# Patient Record
Sex: Male | Born: 1935 | Race: White | Hispanic: No | Smoking: Former smoker
Health system: Southern US, Community
[De-identification: ages and names within clinical notes are randomized; demographics above are authoritative.]

## PROBLEM LIST (undated history)

## (undated) DIAGNOSIS — J449 Chronic obstructive pulmonary disease, unspecified: Secondary | ICD-10-CM

## (undated) DIAGNOSIS — I714 Abdominal aortic aneurysm, without rupture, unspecified: Secondary | ICD-10-CM

## (undated) HISTORY — DX: Abdominal aortic aneurysm, without rupture, unspecified: I71.40

## (undated) HISTORY — DX: Chronic obstructive pulmonary disease, unspecified: J44.9

## (undated) HISTORY — DX: Abdominal aortic aneurysm, without rupture: I71.4

## (undated) HISTORY — PX: ABDOMINAL AORTIC ANEURYSM REPAIR: SUR1152

## (undated) HISTORY — PX: TONSILLECTOMY: SUR1361

## (undated) HISTORY — PX: APPENDECTOMY: SHX54

## (undated) HISTORY — PX: REPLACEMENT TOTAL KNEE: SUR1224

---

## 2009-06-02 DIAGNOSIS — J309 Allergic rhinitis, unspecified: Secondary | ICD-10-CM | POA: Insufficient documentation

## 2012-12-26 ENCOUNTER — Ambulatory Visit: Payer: Self-pay | Admitting: Ophthalmology

## 2012-12-26 LAB — POTASSIUM: Potassium: 4.4 mmol/L (ref 3.5–5.1)

## 2013-01-02 ENCOUNTER — Ambulatory Visit: Payer: Self-pay | Admitting: Ophthalmology

## 2013-01-11 ENCOUNTER — Ambulatory Visit: Payer: Self-pay | Admitting: Ophthalmology

## 2013-01-23 ENCOUNTER — Ambulatory Visit: Payer: Self-pay | Admitting: Ophthalmology

## 2014-04-01 DIAGNOSIS — E669 Obesity, unspecified: Secondary | ICD-10-CM | POA: Insufficient documentation

## 2014-04-01 DIAGNOSIS — K279 Peptic ulcer, site unspecified, unspecified as acute or chronic, without hemorrhage or perforation: Secondary | ICD-10-CM | POA: Insufficient documentation

## 2014-04-01 DIAGNOSIS — Z87891 Personal history of nicotine dependence: Secondary | ICD-10-CM | POA: Insufficient documentation

## 2014-04-01 DIAGNOSIS — N400 Enlarged prostate without lower urinary tract symptoms: Secondary | ICD-10-CM | POA: Insufficient documentation

## 2014-04-01 DIAGNOSIS — R059 Cough, unspecified: Secondary | ICD-10-CM | POA: Insufficient documentation

## 2014-04-01 DIAGNOSIS — R0902 Hypoxemia: Secondary | ICD-10-CM | POA: Insufficient documentation

## 2014-04-01 DIAGNOSIS — R1310 Dysphagia, unspecified: Secondary | ICD-10-CM | POA: Insufficient documentation

## 2014-04-01 DIAGNOSIS — M1990 Unspecified osteoarthritis, unspecified site: Secondary | ICD-10-CM | POA: Insufficient documentation

## 2014-05-11 DIAGNOSIS — J387 Other diseases of larynx: Secondary | ICD-10-CM | POA: Insufficient documentation

## 2014-12-07 NOTE — Op Note (Signed)
PATIENT NAME:  Brent Liu, [name] MR#:  [id number] DATE OF BIRTH:  07/01/1936  DATE OF PROCEDURE:  01/02/2013  PREOPERATIVE DIAGNOSIS: Cataract, right eye.   POSTOPERATIVE DIAGNOSIS:  Cataract, right eye.  PROCEDURE PERFORMED:  Extracapsular cataract extraction using phacoemulsification with placement of an Alcon SN6CWS 24.5-diopter posterior chamber lens, serial [id number].  SURGEON:  Maylon PeppersSteven A. Analyn Matusek, MD  ASSISTANT:  None.  ANESTHESIA:  4% lidocaine and 0.75% Marcaine in a 50/50 mixture with 10 units/mL of Hylenex added given as a peribulbar.  ANESTHESIOLOGIST:  Linward NatalAmy Rice, MD  COMPLICATIONS:  None.  ESTIMATED BLOOD LOSS:  Less than 1 mL.  DESCRIPTION OF PROCEDURE:  The patient was brought to the operating room and given a peribulbar block.  The patient was then prepped and draped in the usual fashion.  The vertical rectus muscles were imbricated using 5-0 silk sutures.  These sutures were then clamped to the sterile drapes as bridle sutures.  A limbal peritomy was performed extending two clock hours and hemostasis was obtained with cautery.  A partial thickness scleral groove was made at the surgical limbus and dissected anteriorly in a lamellar dissection using an Alcon crescent knife.  The anterior chamber was entered superonasally with a Superblade and through the lamellar dissection with a 2.6 mm keratome.  DisCoVisc was used to replace the aqueous and a continuous tear capsulorrhexis was carried out.  Hydrodissection and hydrodelineation were carried out with balanced salt and a 27 gauge canula.  The nucleus was rotated to confirm the effectiveness of the hydrodissection.  Phacoemulsification was carried out using a divide-and-conquer technique.  Total ultrasound time was 1 minute and 23.4 seconds with an average power of 23.5 percent.  CDE of 33.19.  Irrigation/aspiration was used to remove the residual cortex.  DisCoVisc was used to inflate the capsule and the internal incision  was enlarged to 3 mm with the crescent knife.  The intraocular lens was folded and inserted into the capsular bag using the AcrySert Delivery System.   Irrigation/aspiration was used to remove the residual DisCoVisc.  Miostat was injected into the anterior chamber through the paracentesis track to inflate the anterior chamber and induce miosis.  The wound was checked for leaks and none were found. The conjunctiva was closed with cautery and the bridle sutures were removed.  Two drops of 0.3% Vigamox were placed on the eye.   An eye shield was placed on the eye.  The patient was discharged to the recovery room in good condition.   ____________________________ Maylon PeppersSteven A. Gershon Shorten, MD sad:cb D: 01/02/2013 14:07:07 ET T: 01/02/2013 15:56:43 ET JOB#: [id number]  cc: Viviann SpareSteven A. Miryah Ralls, MD, <Dictator> Erline LevineSTEVEN A Sabastien Tyler MD ELECTRONICALLY SIGNED 01/16/2013 13:56

## 2014-12-07 NOTE — Op Note (Signed)
PATIENT NAME:  Brent Liu, [name] MR#:  [id number] DATE OF BIRTH:  24-Dec-1935  DATE OF PROCEDURE:  01/23/2013  PREOPERATIVE DIAGNOSIS:  Cataract, left eye.    POSTOPERATIVE DIAGNOSIS:  Cataract, left eye.  PROCEDURE PERFORMED:  Extracapsular cataract extraction using phacoemulsification with placement of an Alcon SN6CWS, 25.0-diopter posterior chamber lens, serial #[id number].  SURGEON:  Maylon PeppersSteven A. Farren Nelles, MD  ASSISTANT:  None.  ANESTHESIA:  4% lidocaine and 0.75% Marcaine in a 50/50 mixture with 10 units/mL of Hylenex added, given as a peribulbar.   ANESTHESIOLOGIST:  Dr. Dimple Caseyice.  COMPLICATIONS:  None.  ESTIMATED BLOOD LOSS:  Less than 1 ml.  DESCRIPTION OF PROCEDURE:  The patient was brought to the operating room and given a peribulbar block.  The patient was then prepped and draped in the usual fashion.  The vertical rectus muscles were imbricated using 5-0 silk sutures.  These sutures were then clamped to the sterile drapes as bridle sutures.  A limbal peritomy was performed extending two clock hours and hemostasis was obtained with cautery.  A partial thickness scleral groove was made at the surgical limbus and dissected anteriorly in a lamellar dissection using an Alcon crescent knife.  The anterior chamber was entered supero-temporally with a Superblade and through the lamellar dissection with a 2.6 mm keratome.  DisCoVisc was used to replace the aqueous and a continuous tear capsulorrhexis was carried out.  Hydrodissection and hydrodelineation were carried out with balanced salt and a 27 gauge canula.  The nucleus was rotated to confirm the effectiveness of the hydrodissection.  Phacoemulsification was carried out using a divide-and-conquer technique.  Total ultrasound time was 1 minute and 10 seconds with an average power of 24.7 percent and CDE of 31.0.  Irrigation/aspiration was used to remove the residual cortex.  DisCoVisc was used to inflate the capsule and the internal  incision was enlarged to 3 mm with the crescent knife.  The intraocular lens was folded and inserted into the capsular bag using the AcrySert delivery system.  Irrigation/aspiration was used to remove the residual DisCoVisc.  Miostat was injected into the anterior chamber through the paracentesis track to inflate the anterior chamber and induce miosis.  A tenth of a milliliter of cefuroxime was placed into the anterior chamber via the paracentesis tract. The wound was checked for leaks and none were found. The conjunctiva was closed with cautery and the bridle sutures were removed.  Two drops of 0.3% Vigamox were placed on the eye.   An eye shield was placed on the eye.  The patient was discharged to the recovery room in good condition. ____________________________ Maylon PeppersSteven A. Maleah Rabago, MD sad:sb D: 01/23/2013 13:58:22 ET T: 01/23/2013 14:34:07 ET JOB#: [id number]  cc: Viviann SpareSteven A. Nakya Weyand, MD, <Dictator> Erline LevineSTEVEN A Haris Baack MD ELECTRONICALLY SIGNED 01/30/2013 13:32

## 2018-05-12 ENCOUNTER — Other Ambulatory Visit (HOSPITAL_COMMUNITY): Payer: Self-pay | Admitting: Pulmonary Disease

## 2018-05-12 ENCOUNTER — Other Ambulatory Visit: Payer: Self-pay | Admitting: Pulmonary Disease

## 2018-05-12 DIAGNOSIS — R7989 Other specified abnormal findings of blood chemistry: Secondary | ICD-10-CM

## 2018-05-13 ENCOUNTER — Encounter
Admission: RE | Admit: 2018-05-13 | Discharge: 2018-05-13 | Disposition: A | Discharge status: Home / Self Care | Payer: Medicare Other | Source: Ambulatory Visit | Attending: Pulmonary Disease | Admitting: Pulmonary Disease

## 2018-05-13 ENCOUNTER — Ambulatory Visit
Admission: RE | Admit: 2018-05-13 | Discharge: 2018-05-13 | Disposition: A | Discharge status: Home / Self Care | Payer: Medicare Other | Source: Ambulatory Visit | Attending: Pulmonary Disease | Admitting: Pulmonary Disease

## 2018-05-13 DIAGNOSIS — R7989 Other specified abnormal findings of blood chemistry: Secondary | ICD-10-CM

## 2018-05-13 MED ORDER — TECHNETIUM TO 99M ALBUMIN AGGREGATED
4.0000 | Freq: Once | INTRAVENOUS | Status: AC
  Administered 2018-05-13: 3.92 via INTRAVENOUS

## 2018-05-13 MED ORDER — TECHNETIUM TC 99M DIETHYLENETRIAME-PENTAACETIC ACID
30.0000 | Freq: Once | INTRAVENOUS | Status: AC
  Administered 2018-05-13: 35.26 via RESPIRATORY_TRACT

## 2018-09-21 DIAGNOSIS — R55 Syncope and collapse: Secondary | ICD-10-CM | POA: Insufficient documentation

## 2018-10-06 ENCOUNTER — Encounter (INDEPENDENT_AMBULATORY_CARE_PROVIDER_SITE_OTHER): Payer: Self-pay

## 2018-11-10 ENCOUNTER — Ambulatory Visit (INDEPENDENT_AMBULATORY_CARE_PROVIDER_SITE_OTHER): Payer: Medicare Other | Admitting: Vascular Surgery

## 2018-11-10 ENCOUNTER — Other Ambulatory Visit: Payer: Self-pay

## 2018-11-10 ENCOUNTER — Encounter (INDEPENDENT_AMBULATORY_CARE_PROVIDER_SITE_OTHER): Payer: Self-pay | Admitting: Vascular Surgery

## 2018-11-10 DIAGNOSIS — E785 Hyperlipidemia, unspecified: Secondary | ICD-10-CM | POA: Insufficient documentation

## 2018-11-10 DIAGNOSIS — J439 Emphysema, unspecified: Secondary | ICD-10-CM

## 2018-11-10 DIAGNOSIS — I890 Lymphedema, not elsewhere classified: Secondary | ICD-10-CM | POA: Insufficient documentation

## 2018-11-10 DIAGNOSIS — I739 Peripheral vascular disease, unspecified: Secondary | ICD-10-CM | POA: Diagnosis not present

## 2018-11-10 DIAGNOSIS — I714 Abdominal aortic aneurysm, without rupture, unspecified: Secondary | ICD-10-CM | POA: Insufficient documentation

## 2018-11-10 DIAGNOSIS — J449 Chronic obstructive pulmonary disease, unspecified: Secondary | ICD-10-CM | POA: Insufficient documentation

## 2018-11-10 DIAGNOSIS — K219 Gastro-esophageal reflux disease without esophagitis: Secondary | ICD-10-CM | POA: Insufficient documentation

## 2018-11-10 DIAGNOSIS — E782 Mixed hyperlipidemia: Secondary | ICD-10-CM | POA: Diagnosis not present

## 2018-11-10 DIAGNOSIS — Z79899 Other long term (current) drug therapy: Secondary | ICD-10-CM

## 2018-11-10 NOTE — Progress Notes (Signed)
MRN : [id number]  Brent Liu is a 83 y.o. ([birth year]) male who presents with chief complaint of  Chief Complaint  Patient presents with  . New Patient (Initial Visit)    ref [facility] for hx of AAA repair  .  History of Present Illness: The patient presents to the office for initial evaluation and surveillance of an abdominal aortic aneurysm status post stent graft placement in 2015.  Aneurysm was fixed in [place] and he states he has not had any follow up since repair.  Patient denies abdominal pain or back pain, no other abdominal complaints. No groin related complaints. No symptoms consistent with distal embolization No changes in claudication distance.   There have been no interval changes in his overall healthcare since his last visit.   Patient denies amaurosis fugax or TIA symptoms. There is no history of claudication or rest pain symptoms of the lower extremities. The patient denies angina or shortness of breath.   Carotid duplex shows <40% bilateral ICA stenosis  Current Meds  Medication Sig  . acetaminophen (TYLENOL) 325 MG tablet Take by mouth.  . Albuterol Sulfate 108 (90 Base) MCG/ACT AEPB Inhale into the lungs every 4 (four) hours as needed.   . Ascorbic Acid (VITAMIN C) 1000 MG tablet Take 1,000 mg by mouth daily.  Marland Kitchen aspirin EC 81 MG tablet Take by mouth.  Marland Kitchen atorvastatin (LIPITOR) 40 MG tablet TK 1 T PO QD  . b complex vitamins tablet Take by mouth.  Marland Kitchen CALCIUM CITRATE PO Take by mouth.  . cetirizine (ZYRTEC) 10 MG tablet TK 1 T PO D  . Cholecalciferol (D3-1000 PO) Take by mouth daily.  . COD LIVER OIL PO Take by mouth as directed.  . docusate sodium (COLACE) 100 MG capsule Take by mouth.  . finasteride (PROSCAR) 5 MG tablet TK 1 T PO D  . magnesium oxide (MAG-OX) 400 MG tablet Take by mouth.  . montelukast (SINGULAIR) 10 MG tablet TK 1 T PO HS  . omeprazole (PRILOSEC) 40 MG capsule TK ONE C PO BID  . oxybutynin (DITROPAN) 5 MG tablet TK 1 T PO BID  .  Potassium (GNP POTASSIUM) 99 MG TABS Take by mouth.  . STIOLTO RESPIMAT 2.5-2.5 MCG/ACT AERS INL 2 PFS PO QD  . tamsulosin (FLOMAX) 0.4 MG CAPS capsule TK 1 C PO D  . vitamin B-12 (CYANOCOBALAMIN) 1000 MCG tablet Take by mouth.    Past Medical History:  Diagnosis Date  . AAA (abdominal aortic aneurysm) (HCC)   . COPD (chronic obstructive pulmonary disease) (HCC)       Social History Social History   Tobacco Use  . Smoking status: Former Games developer  . Smokeless tobacco: Never Used  Substance Use Topics  . Alcohol use: Never    Frequency: Never  . Drug use: Never    Family History Family History  Problem Relation Age of Onset  . Cancer Mother   . Stroke Father   . Heart attack Father   No family history of bleeding/clotting disorders, porphyria or autoimmune disease   No Known Allergies   REVIEW OF SYSTEMS (Negative unless checked)  Constitutional: [] Weight loss  [] Fever  [] Chills Cardiac: [] Chest pain   [] Chest pressure   [] Palpitations   [] Shortness of breath when laying flat   [] Shortness of breath with exertion. Vascular:  [x] Pain in legs with walking   [] Pain in legs at rest  [] History of DVT   [] Phlebitis   [x] Swelling in legs   [] Varicose  veins   Non-healing ulcers Pulmonary:   Uses home oxygen   Productive cough   Hemoptysis   Wheeze  COPD   Asthma Neurologic:  Dizziness   Seizures   History of stroke   History of TIA  Aphasia   Vissual changes   Weakness or numbness in arm   Weakness or numbness in leg Musculoskeletal:   Joint swelling   Joint pain   Low back pain Hematologic:  Easy bruising  Easy bleeding   Hypercoagulable state   Anemic Gastrointestinal:  Diarrhea   Vomiting  Gastroesophageal reflux/heartburn   Difficulty swallowing. Genitourinary:  Chronic kidney disease   Difficult urination  Frequent urination   Blood in urine Skin:  Rashes   Ulcers  Psychological:  History of anxiety     History of major depression.  Physical Examination  Vitals:   11/10/18 0901  BP: (!) 145/77  Pulse: 71  Resp: 16  Weight: 241 lb (109.3 kg)  Height:  (1.778 m)   Body mass index is 34.58 kg/m. Gen: WD/WN, NAD Head: [place]/AT, No temporalis wasting.  Ear/Nose/Throat: Hearing grossly intact, nares w/o erythema or drainage, poor dentition Eyes: PER, EOMI, sclera nonicteric.  Neck: Supple, no masses.  No bruit or JVD.  Pulmonary:  Good air movement, clear to auscultation bilaterally, no use of accessory muscles.  Cardiac: RRR, normal S1, S2, no Murmurs. Vascular:  Popliteal pulses are 1+; scattered varicosities present bilaterally.  Mild venous stasis changes to the legs bilaterally.  1-2+ soft pitting edema Vessel Right Left  Radial Palpable Palpable  Popliteal Palpable Palpable  PT Not Palpable Not Palpable  DP Not Palpable Not Palpable  Gastrointestinal: soft, non-distended. No guarding/no peritoneal signs.  Musculoskeletal: M/S 5/5 throughout.  No deformity or atrophy.  Neurologic: CN 2-12 intact. Pain and light touch intact in extremities.  Symmetrical.  Speech is fluent. Motor exam as listed above. Psychiatric: Judgment intact, Mood & affect appropriate for pt's clinical situation. Dermatologic: No rashes or ulcers noted.  No changes consistent with cellulitis. Lymph : No Cervical lymphadenopathy, no lichenification or skin changes of chronic lymphedema.  CBC No results found for: WBC, HGB, HCT, MCV, PLT  BMET    Component Value Date/Time   K 4.7 01/11/2013 1210   CrCl cannot be calculated (No successful lab value found.).  COAG No results found for: INR, PROTIME  Radiology No results found.   Assessment/Plan 1. AAA (abdominal aortic aneurysm) without rupture (HCC) Recommend: Patient is status post successful endovascular repair of the AAA.   He is overdo for his duplex ultrasound and this will be ordered for about 6-8 weeks.  The patient will continue  antiplatelet therapy as prescribed as well as aggressive management of hyperlipidemia. Exercise is again strongly encouraged.   However, endografts require continued surveillance with ultrasound or CT scan. This is mandatory to detect any changes that allow repressurization of the aneurysm sac.  The patient is informed that this would be asymptomatic.  The patient is reminded that lifelong routine surveillance is a necessity with an endograft. Patient will continue to follow-up at 2 month intervals with ultrasound of the aorta.  - VAS US AORTA/IVC/ILIACS; Future  2. PAD (peripheral artery disease) (HCC)  Recommend:  The patient has evidence of atherosclerosis of the lower extremities with claudication.  The patient does not voice lifestyle limiting changes at this point in time.  Noninvasive studies do not suggest clinically significant change.  No invasive studies, angiography or surgery at this time The patient  should continue walking and begin a more formal exercise program.  The patient should continue antiplatelet therapy and aggressive treatment of the lipid abnormalities  No changes in the patient's medications at this time  The patient should continue wearing graduated compression socks 10-15 mmHg strength to control the mild edema.   - VAS [place] ABI WITH/WO TBI; Future  3. Lymphedema I have had a long discussion with the patient regarding swelling and why it  causes symptoms.  Patient will begin wearing graduated compression stockings class 1 (20-30 mmHg) on a daily basis a prescription was given. The patient will  beginning wearing the stockings first thing in the morning and removing them in the evening. The patient is instructed specifically not to sleep in the stockings.   In addition, behavioral modification will be initiated.  This will include frequent elevation, use of over the counter pain medications and exercise such as walking.  I have reviewed systemic causes for  chronic edema such as liver, kidney and cardiac etiologies.  The patient denies problems with these organ systems.    Consideration for a lymph pump will also be made based upon the effectiveness of conservative therapy.  This would help to improve the edema control and prevent sequela such as ulcers and infections   The patient will follow-up with me after the ultrasound.    4. Mixed hyperlipidemia Continue antihypertensive medications as already ordered, these medications have been reviewed and there are no changes at this time.   5. Pulmonary emphysema, unspecified emphysema type (HCC) Continue pulmonary medications and aerosols as already ordered, these medications have been reviewed and there are no changes at this time.    6. Gastroesophageal reflux disease without esophagitis Continue PPI as already ordered, this medication has been reviewed and there are no changes at this time.  Avoidence of caffeine and alcohol  Moderate elevation of the head of the bed    Levora Dredge, MD  11/10/2018 10:29 AM

## 2018-12-26 ENCOUNTER — Ambulatory Visit (INDEPENDENT_AMBULATORY_CARE_PROVIDER_SITE_OTHER): Payer: Medicare Other

## 2018-12-26 ENCOUNTER — Other Ambulatory Visit: Payer: Self-pay

## 2018-12-26 ENCOUNTER — Other Ambulatory Visit (INDEPENDENT_AMBULATORY_CARE_PROVIDER_SITE_OTHER): Payer: Self-pay | Admitting: Vascular Surgery

## 2018-12-26 ENCOUNTER — Ambulatory Visit (INDEPENDENT_AMBULATORY_CARE_PROVIDER_SITE_OTHER): Payer: Medicare Other | Admitting: Nurse Practitioner

## 2018-12-26 DIAGNOSIS — I714 Abdominal aortic aneurysm, without rupture, unspecified: Secondary | ICD-10-CM

## 2018-12-26 DIAGNOSIS — I739 Peripheral vascular disease, unspecified: Secondary | ICD-10-CM

## 2019-01-02 ENCOUNTER — Encounter (INDEPENDENT_AMBULATORY_CARE_PROVIDER_SITE_OTHER): Payer: Self-pay | Admitting: Nurse Practitioner

## 2019-01-12 ENCOUNTER — Other Ambulatory Visit (INDEPENDENT_AMBULATORY_CARE_PROVIDER_SITE_OTHER): Payer: Self-pay

## 2019-01-12 ENCOUNTER — Other Ambulatory Visit (INDEPENDENT_AMBULATORY_CARE_PROVIDER_SITE_OTHER): Payer: Self-pay | Admitting: Vascular Surgery

## 2019-01-12 DIAGNOSIS — I714 Abdominal aortic aneurysm, without rupture, unspecified: Secondary | ICD-10-CM

## 2019-07-03 ENCOUNTER — Ambulatory Visit (INDEPENDENT_AMBULATORY_CARE_PROVIDER_SITE_OTHER): Payer: Medicare Other | Admitting: Vascular Surgery

## 2019-07-03 ENCOUNTER — Ambulatory Visit (INDEPENDENT_AMBULATORY_CARE_PROVIDER_SITE_OTHER): Payer: Medicare Other

## 2019-07-03 ENCOUNTER — Encounter (INDEPENDENT_AMBULATORY_CARE_PROVIDER_SITE_OTHER): Payer: Self-pay | Admitting: Vascular Surgery

## 2019-07-03 ENCOUNTER — Other Ambulatory Visit: Payer: Self-pay

## 2019-07-03 VITALS — BP 144/71 | HR 54 | Resp 14 | Ht 70.0 in | Wt 229.0 lb

## 2019-07-03 DIAGNOSIS — I714 Abdominal aortic aneurysm, without rupture, unspecified: Secondary | ICD-10-CM

## 2019-07-03 DIAGNOSIS — J439 Emphysema, unspecified: Secondary | ICD-10-CM

## 2019-07-03 DIAGNOSIS — E782 Mixed hyperlipidemia: Secondary | ICD-10-CM

## 2019-07-03 DIAGNOSIS — K219 Gastro-esophageal reflux disease without esophagitis: Secondary | ICD-10-CM

## 2019-07-03 DIAGNOSIS — I6529 Occlusion and stenosis of unspecified carotid artery: Secondary | ICD-10-CM | POA: Insufficient documentation

## 2019-07-03 DIAGNOSIS — I6523 Occlusion and stenosis of bilateral carotid arteries: Secondary | ICD-10-CM | POA: Diagnosis not present

## 2019-07-03 DIAGNOSIS — I739 Peripheral vascular disease, unspecified: Secondary | ICD-10-CM | POA: Diagnosis not present

## 2019-07-03 NOTE — Progress Notes (Signed)
MRN : [id number]  Brent Liu is a 83 y.o. (11/23/1935) male who presents with chief complaint of  Chief Complaint  Patient presents with   Follow-up    ultrasound  .  History of Present Illness:   The patient presents to the office for initial evaluation and surveillance of an abdominal aortic aneurysm status post stent graft placement in 2015.  Aneurysm was fixed in [place] and he states he has not had any follow up since repair.  Patient denies abdominal pain or back pain, no other abdominal complaints. No groin related complaints. No symptoms consistent with distal embolization No changes in claudication distance.   There have been no interval changes in his overall healthcare since his last visit.   Patient denies amaurosis fugax or TIA symptoms. There is no history of claudication or rest pain symptoms of the lower extremities. The patient denies angina or shortness of breath.   Duplex ultrasound of the aorta shows an intact stent graft no endoleak sac is 3.2 cm  Previous carotid duplex shows <40% bilateral ICA stenosis  Current Meds  Medication Sig   acetaminophen (TYLENOL) 325 MG tablet Take by mouth.   Albuterol Sulfate 108 (90 Base) MCG/ACT AEPB Inhale into the lungs every 4 (four) hours as needed.    Ascorbic Acid (VITAMIN C) 1000 MG tablet Take 1,000 mg by mouth daily.   aspirin EC 81 MG tablet Take by mouth.   b complex vitamins tablet Take by mouth.   Cholecalciferol (D3-1000 PO) Take by mouth daily.   COD LIVER OIL PO Take by mouth as directed.   docusate sodium (COLACE) 100 MG capsule Take by mouth.   finasteride (PROSCAR) 5 MG tablet TK 1 T PO D   magnesium oxide (MAG-OX) 400 MG tablet Take by mouth.   omeprazole (PRILOSEC) 40 MG capsule TK ONE C PO BID   oxybutynin (DITROPAN) 5 MG tablet TK 1 T PO BID   Potassium (GNP POTASSIUM) 99 MG TABS Take by mouth.   tamsulosin (FLOMAX) 0.4 MG CAPS capsule TK 1 C PO D   vitamin B-12  (CYANOCOBALAMIN) 1000 MCG tablet Take by mouth.    Past Medical History:  Diagnosis Date   AAA (abdominal aortic aneurysm) (HCC)    COPD (chronic obstructive pulmonary disease) (HCC)     Past Surgical History:  Procedure Laterality Date   ABDOMINAL AORTIC ANEURYSM REPAIR     APPENDECTOMY     REPLACEMENT TOTAL KNEE Bilateral    TONSILLECTOMY      Social History Social History   Tobacco Use   Smoking status: Former Smoker   Smokeless tobacco: Never Used  Substance Use Topics   Alcohol use: Never    Frequency: Never   Drug use: Never    Family History Family History  Problem Relation Age of Onset   Cancer Mother    Stroke Father    Heart attack Father     No Known Allergies   REVIEW OF SYSTEMS (Negative unless checked)  Constitutional: [] Weight loss  [] Fever  [] Chills Cardiac: [] Chest pain   [] Chest pressure   [] Palpitations   [] Shortness of breath when laying flat   [] Shortness of breath with exertion. Vascular:  [] Pain in legs with walking   [] Pain in legs at rest  [] History of DVT   [] Phlebitis   [] Swelling in legs   [] Varicose veins   [] Non-healing ulcers Pulmonary:   [] Uses home oxygen   [] Productive cough   [] Hemoptysis   [] Wheeze  []   COPD   [] Asthma Neurologic:  [] Dizziness   [] Seizures   [] History of stroke   [] History of TIA  [] Aphasia   [] Vissual changes   [] Weakness or numbness in arm   [] Weakness or numbness in leg Musculoskeletal:   [] Joint swelling   [] Joint pain   [] Low back pain Hematologic:  [] Easy bruising  [] Easy bleeding   [] Hypercoagulable state   [] Anemic Gastrointestinal:  [] Diarrhea   [] Vomiting  [] Gastroesophageal reflux/heartburn   [] Difficulty swallowing. Genitourinary:  [] Chronic kidney disease   [] Difficult urination  [] Frequent urination   [] Blood in urine Skin:  [] Rashes   [] Ulcers  Psychological:  [] History of anxiety   []  History of major depression.  Physical Examination  Vitals:   07/03/19 0848  BP: (!) 144/71    Pulse: (!) 54  Resp: 14  Weight: 229 lb (103.9 kg)  Height: 5\' 10"  (1.778 m)   Body mass index is 32.86 kg/m. Gen: WD/WN, NAD Head: [place]/AT, No temporalis wasting.  Ear/Nose/Throat: Hearing grossly intact, nares w/o erythema or drainage Eyes: PER, EOMI, sclera nonicteric.  Neck: Supple, no large masses.   Pulmonary:  Good air movement, no audible wheezing bilaterally, no use of accessory muscles.  Cardiac: RRR, no JVD Vascular:  Soft carotid bruit Vessel Right Left  Carotid Palpable Palpable  PT Not Palpable Not Palpable  DP Not Palpable Not Palpable  Gastrointestinal: Non-distended. No guarding/no peritoneal signs.  Musculoskeletal: M/S 5/5 throughout.  No deformity or atrophy.  Neurologic: CN 2-12 intact. Symmetrical.  Speech is fluent. Motor exam as listed above. Psychiatric: Judgment intact, Mood & affect appropriate for pt's clinical situation. Dermatologic: No rashes or ulcers noted.  No changes consistent with cellulitis. Lymph : No lichenification or skin changes of chronic lymphedema.  CBC No results found for: WBC, HGB, HCT, MCV, PLT  BMET    Component Value Date/Time   K 4.7 01/11/2013 1210   CrCl cannot be calculated (No successful lab value found.).  COAG No results found for: INR, PROTIME  Radiology No results found.    Assessment/Plan 1. AAA (abdominal aortic aneurysm) without rupture (HCC) Recommend: Patient is status post successful endovascular repair of the AAA.    The patient will continue antiplatelet therapy as prescribed as well as aggressive management of hyperlipidemia. Exercise is again strongly encouraged.   However, endografts require continued surveillance with ultrasound or CT scan. This is mandatory to detect any changes that allow repressurization of the aneurysm sac.  The patient is informed that this would be asymptomatic.  The patient is reminded that lifelong routine surveillance is a necessity with an endograft. Patient will  continue to follow-up at 12 month intervals with ultrasound of the aorta.  - IVC/ILIACS DUPLEX; Future  2. PAD (peripheral artery disease) (HCC) Recommend:  The patient has evidence of atherosclerosis of the lower extremities with claudication.  The patient does not voice lifestyle limiting changes at this point in time.  Noninvasive studies do not suggest clinically significant change.  No invasive studies, angiography or surgery at this time The patient should continue walking and begin a more formal exercise program.  The patient should continue antiplatelet therapy and aggressive treatment of the lipid abnormalities  No changes in the patient's medications at this time  The patient should continue wearing graduated compression socks 10-15 mmHg strength to control the mild edema.   3. Bilateral carotid artery stenosis Recommend:  Given the patient's asymptomatic subcritical stenosis no further invasive testing or surgery at this time.  Duplex ultrasound shows <40%  stenosis bilaterally.  Continue antiplatelet therapy as prescribed Continue management of CAD, HTN and Hyperlipidemia Healthy heart diet,  encouraged exercise at least 4 times per week Follow up in 24 months with duplex ultrasound and physical exam   4. Pulmonary emphysema, unspecified emphysema type ([place]) Continue pulmonary medications and aerosols as already ordered, these medications have been reviewed and there are no changes at this time.    5. Gastroesophageal reflux disease without esophagitis Continue PPI as already ordered, this medication has been reviewed and there are no changes at this time.  Avoidence of caffeine and alcohol  Moderate elevation of the head of the bed   6. Mixed hyperlipidemia Continue statin as ordered and reviewed, no changes at this time     Hortencia Pilar, MD  07/03/2019 9:12 AM

## 2020-05-23 IMAGING — NM NM PULMONARY VENT & PERF
2 series · 16 of 16 positions shown · non-contrast
Comparison: Chest radiograph May 13, 2018

CLINICAL DATA: Chronic shortness of breath.  Positive D-dimer study

EXAM:
NUCLEAR MEDICINE VENTILATION - PERFUSION LUNG SCAN
VIEWS:
Anterior, posterior, left lateral, right lateral, RPO, LPO, RAO,
LAO-ventilation and perfusion
RADIOPHARMACEUTICALS:  35.26 mCi of Ac-77m DTPA aerosol inhalation
and 3.92 mCi TcVVm-TGG IV

[Series 1000: lung ventilation · 4.80mm/px · 4 acquisitions, 8 frames shown]
[im 1/4]
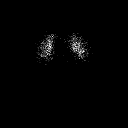
[im 1/4]
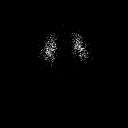
[im 2/4]
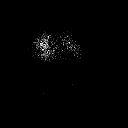
[im 2/4]
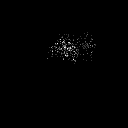
[im 3/4]
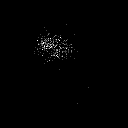
[im 3/4]
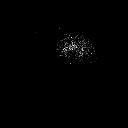
[im 4/4]
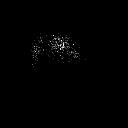
[im 4/4]
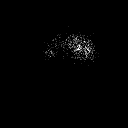

[Series 1000: lung perfusion · 1.95mm/px · 4 acquisitions, 8 frames shown]
[im 1/4]
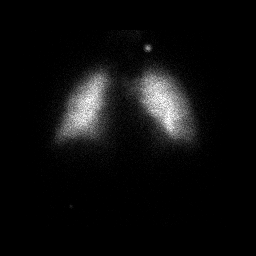
[im 1/4]
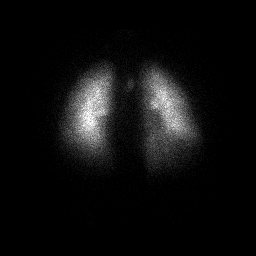
[im 2/4]
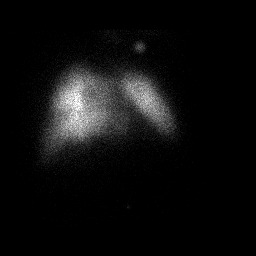
[im 2/4]
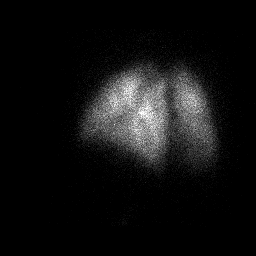
[im 3/4]
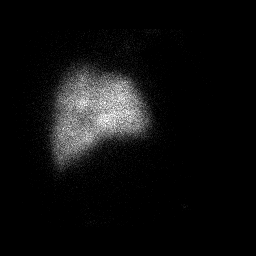
[im 3/4]
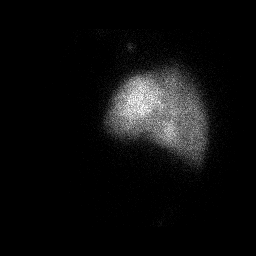
[im 4/4]
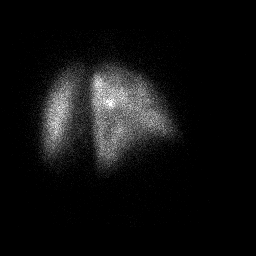
[im 4/4]
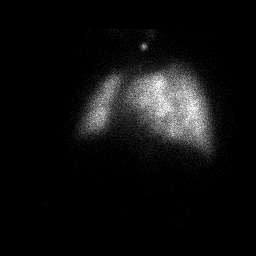

[16 of 16 positions shown; findings below may reference images not displayed]

FINDINGS: Ventilation: Radiotracer uptake is homogeneous and symmetric
bilaterally. No ventilation defects are evident.

Perfusion: Radiotracer uptake is homogeneous and symmetric
bilaterally. No perfusion defects are evident.
IMPRESSION: No appreciable ventilation or perfusion defects. This study
constitutes a very low probability of pulmonary embolus.

## 2020-07-03 NOTE — Progress Notes (Deleted)
MRN : [id number]  Brent Liu is a 84 y.o. ([birth year]) male who presents with chief complaint of No chief complaint on file. Marland Kitchen  History of Present Illness:   The patientpresentsto the office forinitial evaluation andsurveillance of an abdominal aortic aneurysm status post stent graft placementin 2015.Aneurysm was fixed in [place] and he states he has not had any follow up since repair.  Patient denies abdominal pain or back pain, no other abdominal complaints. No groin related complaints. No symptoms consistent with distal embolization No changes in claudication distance.   There have been no interval changes in his overall healthcare since his last visit.   Patient denies amaurosis fugax or TIA symptoms. There is no history of claudication or rest pain symptoms of the lower extremities. The patient denies angina or shortness of breath.  Duplex ultrasound of the aorta shows an intact stent graft no endoleak sac is 3.2 cm  Previous carotid duplex shows <40% bilateral ICA stenosis  No outpatient medications have been marked as taking for the 07/04/20 encounter (Appointment) with Gilda Crease, Latina Craver, MD.    Past Medical History:  Diagnosis Date  . AAA (abdominal aortic aneurysm) (HCC)   . COPD (chronic obstructive pulmonary disease) (HCC)     Past Surgical History:  Procedure Laterality Date  . ABDOMINAL AORTIC ANEURYSM REPAIR    . APPENDECTOMY    . REPLACEMENT TOTAL KNEE Bilateral   . TONSILLECTOMY      Social History Social History   Tobacco Use  . Smoking status: Former Games developer  . Smokeless tobacco: Never Used  Substance Use Topics  . Alcohol use: Never  . Drug use: Never    Family History Family History  Problem Relation Age of Onset  . Cancer Mother   . Stroke Father   . Heart attack Father     No Known Allergies   REVIEW OF SYSTEMS (Negative unless checked)  Constitutional: [] Weight loss  [] Fever  [] Chills Cardiac: [] Chest pain    [] Chest pressure   [] Palpitations   [] Shortness of breath when laying flat   [] Shortness of breath with exertion. Vascular:  [x] Pain in legs with walking   [] Pain in legs at rest  [] History of DVT   [] Phlebitis   [] Swelling in legs   [] Varicose veins   [] Non-healing ulcers Pulmonary:   [] Uses home oxygen   [] Productive cough   [] Hemoptysis   [] Wheeze  [] COPD   [] Asthma Neurologic:  [] Dizziness   [] Seizures   [] History of stroke   [] History of TIA  [] Aphasia   [] Vissual changes   [] Weakness or numbness in arm   [] Weakness or numbness in leg Musculoskeletal:   [] Joint swelling   [x] Joint pain   [] Low back pain Hematologic:  [] Easy bruising  [] Easy bleeding   [] Hypercoagulable state   [] Anemic Gastrointestinal:  [] Diarrhea   [] Vomiting  [x] Gastroesophageal reflux/heartburn   [] Difficulty swallowing. Genitourinary:  [] Chronic kidney disease   [] Difficult urination  [] Frequent urination   [] Blood in urine Skin:  [] Rashes   [] Ulcers  Psychological:  [] History of anxiety   []  History of major depression.  Physical Examination  There were no vitals filed for this visit. There is no height or weight on file to calculate BMI. Gen: WD/WN, NAD Head: [place]/AT, No temporalis wasting.  Ear/Nose/Throat: Hearing grossly intact, nares w/o erythema or drainage Eyes: PER, EOMI, sclera nonicteric.  Neck: Supple, no large masses.   Pulmonary:  Good air movement, no audible wheezing bilaterally, no use of accessory muscles.  Cardiac:  RRR, no JVD Vascular:  Vessel Right Left  Radial Palpable Palpable  Popliteal Palpable Palpable  PT Palpable Palpable  DP Palpable Palpable  Gastrointestinal: Non-distended. No guarding/no peritoneal signs.  Musculoskeletal: M/S 5/5 throughout.  No deformity or atrophy.  Neurologic: CN 2-12 intact. Symmetrical.  Speech is fluent. Motor exam as listed above. Psychiatric: Judgment intact, Mood & affect appropriate for pt's clinical situation. Dermatologic: No rashes or ulcers noted.   No changes consistent with cellulitis. Lymph : No lichenification or skin changes of chronic lymphedema.  CBC No results found for: WBC, HGB, HCT, MCV, PLT  BMET    Component Value Date/Time   K 4.7 01/11/2013 1210   CrCl cannot be calculated (No successful lab value found.).  COAG No results found for: INR, PROTIME  Radiology No results found.  Assessment/Plan There are no diagnoses linked to this encounter.   Levora Dredge, MD  07/03/2020 11:17 AM

## 2020-07-04 ENCOUNTER — Encounter (INDEPENDENT_AMBULATORY_CARE_PROVIDER_SITE_OTHER): Payer: Medicare Other

## 2020-07-04 ENCOUNTER — Ambulatory Visit (INDEPENDENT_AMBULATORY_CARE_PROVIDER_SITE_OTHER): Payer: Medicare Other | Admitting: Vascular Surgery

## 2020-07-04 DIAGNOSIS — I714 Abdominal aortic aneurysm, without rupture: Secondary | ICD-10-CM

## 2020-07-04 DIAGNOSIS — E782 Mixed hyperlipidemia: Secondary | ICD-10-CM

## 2020-07-04 DIAGNOSIS — I6523 Occlusion and stenosis of bilateral carotid arteries: Secondary | ICD-10-CM

## 2020-07-04 DIAGNOSIS — I739 Peripheral vascular disease, unspecified: Secondary | ICD-10-CM

## 2020-07-04 DIAGNOSIS — J439 Emphysema, unspecified: Secondary | ICD-10-CM

## 2020-07-09 DIAGNOSIS — I2510 Atherosclerotic heart disease of native coronary artery without angina pectoris: Secondary | ICD-10-CM | POA: Insufficient documentation

## 2020-09-23 DIAGNOSIS — I350 Nonrheumatic aortic (valve) stenosis: Secondary | ICD-10-CM | POA: Insufficient documentation

## 2020-10-02 ENCOUNTER — Other Ambulatory Visit (INDEPENDENT_AMBULATORY_CARE_PROVIDER_SITE_OTHER): Payer: Self-pay | Admitting: Vascular Surgery

## 2020-10-02 DIAGNOSIS — I714 Abdominal aortic aneurysm, without rupture, unspecified: Secondary | ICD-10-CM

## 2020-10-04 ENCOUNTER — Ambulatory Visit (INDEPENDENT_AMBULATORY_CARE_PROVIDER_SITE_OTHER): Payer: Medicare PPO

## 2020-10-04 ENCOUNTER — Ambulatory Visit (INDEPENDENT_AMBULATORY_CARE_PROVIDER_SITE_OTHER): Payer: Medicare PPO | Admitting: Nurse Practitioner

## 2020-10-04 ENCOUNTER — Other Ambulatory Visit: Payer: Self-pay

## 2020-10-04 VITALS — BP 117/72 | HR 62 | Ht 70.0 in | Wt 234.0 lb

## 2020-10-04 DIAGNOSIS — I714 Abdominal aortic aneurysm, without rupture, unspecified: Secondary | ICD-10-CM

## 2020-10-04 DIAGNOSIS — E782 Mixed hyperlipidemia: Secondary | ICD-10-CM | POA: Diagnosis not present

## 2020-10-04 DIAGNOSIS — I6523 Occlusion and stenosis of bilateral carotid arteries: Secondary | ICD-10-CM

## 2020-10-07 ENCOUNTER — Encounter (INDEPENDENT_AMBULATORY_CARE_PROVIDER_SITE_OTHER): Payer: Self-pay | Admitting: Nurse Practitioner

## 2020-10-07 NOTE — Progress Notes (Signed)
Subjective:    Patient ID: Brent Liu, male    DOB: 04-Jun-1936, 85 y.o.   MRN: [id number] Chief Complaint  Patient presents with  . Follow-up    1 yr U/S     The patient returns to the office for surveillance of an abdominal aortic aneurysm status post stent graft placement in 2015.   Patient denies abdominal pain or back pain, no other abdominal complaints. No groin related complaints. No symptoms consistent with distal embolization No changes in claudication distance.   There have been no interval changes in his overall healthcare since his last visit.   Patient denies amaurosis fugax or TIA symptoms. There is no history of claudication or rest pain symptoms of the lower extremities. The patient denies angina or shortness of breath.   Duplex US of the aorta and iliac arteries shows a 2.98 cm x 2.72cm AAA sac with no endoleak, decrease in the sac compared to the previous study.   Review of Systems  Cardiovascular: Positive for leg swelling.  All other systems reviewed and are negative.      Objective:   Physical Exam Vitals reviewed.  HENT:     Head: Normocephalic.  Cardiovascular:     Rate and Rhythm: Normal rate.     Pulses: Normal pulses.  Pulmonary:     Effort: Pulmonary effort is normal.  Musculoskeletal:     Right lower leg: Edema present.     Left lower leg: Edema present.  Neurological:     Mental Status: He is alert and oriented to person, place, and time.  Psychiatric:        Mood and Affect: Mood normal.        Behavior: Behavior normal.        Thought Content: Thought content normal.        Judgment: Judgment normal.     BP 117/72   Pulse 62   Ht 5\' 10"  (1.778 m)   Wt 234 lb (106.1 kg)   BMI 33.58 kg/m   Past Medical History:  Diagnosis Date  . AAA (abdominal aortic aneurysm) (HCC)   . COPD (chronic obstructive pulmonary disease) (HCC)     Social History   Socioeconomic History  . Marital status: Widowed    Spouse name: Not on file   . Number of children: Not on file  . Years of education: Not on file  . Highest education level: Not on file  Occupational History  . Not on file  Tobacco Use  . Smoking status: Former  . Smokeless tobacco: Never Used  Substance and Sexual Activity  . Alcohol use: Never  . Drug use: Never  . Sexual activity: Not on file  Other Topics Concern  . Not on file  Social History Narrative  . Not on file   Social Determinants of Health   Financial Resource Strain: Not on file  Food Insecurity: Not on file  Transportation Needs: Not on file  Physical Activity: Not on file  Stress: Not on file  Social Connections: Not on file  Intimate Partner Violence: Not on file    Past Surgical History:  Procedure Laterality Date  . ABDOMINAL AORTIC ANEURYSM REPAIR    . APPENDECTOMY    . REPLACEMENT TOTAL KNEE Bilateral   . TONSILLECTOMY      Family History  Problem Relation Age of Onset  . Cancer Mother   . Stroke Father   . Heart attack Father     No Known Allergies  No flowsheet data found.    CMP     Component Value Date/Time   K 4.7 01/11/2013 1210     No results found.     Assessment & Plan:   1. Abdominal aortic aneurysm (AAA) without rupture (HCC) Recommend: Patient is status post successful endovascular repair of the AAA.   No further intervention is required at this time.   No endoleak is detected and the aneurysm sac is stable.  The patient will continue antiplatelet therapy as prescribed as well as aggressive management of hyperlipidemia. Exercise is again strongly encouraged.   However, endografts require continued surveillance with ultrasound or CT scan. This is mandatory to detect any changes that allow repressurization of the aneurysm sac.  The patient is informed that this would be asymptomatic.  The patient is reminded that lifelong routine surveillance is a necessity with an endograft. Patient will continue to follow-up at 12 month intervals  with ultrasound of the aorta. - VAS [place] EVAR DUPLEX; Future  2. Bilateral carotid artery stenosis Patient is not having any worsening symptoms of his TIA-like symptoms or CVA.  Patient is on every other year schedule for carotid artery duplex.  The patient follow-up in 1 year.  Carotid artery duplex in addition to EVAR. - VAS US CAROTID; Future  3. Mixed hyperlipidemia Continue statin as ordered and reviewed, no changes at this time    Current Outpatient Medications on File Prior to Visit  Medication Sig Dispense Refill  . acetaminophen (TYLENOL) 325 MG tablet Take by mouth.    . Albuterol Sulfate 108 (90 Base) MCG/ACT AEPB Inhale into the lungs every 4 (four) hours as needed.     . Ascorbic Acid (VITAMIN C) 1000 MG tablet Take 1,000 mg by mouth daily.    Marland Kitchen aspirin EC 81 MG tablet Take by mouth.    Marland Kitchen atorvastatin (LIPITOR) 40 MG tablet TK 1 T PO QD    . b complex vitamins tablet Take by mouth.    Marland Kitchen CALCIUM CITRATE PO Take by mouth.    . cetirizine (ZYRTEC) 10 MG tablet TK 1 T PO D    . Cholecalciferol (D3-1000 PO) Take by mouth daily.    . COD LIVER OIL PO Take by mouth as directed.    . docusate sodium (COLACE) 100 MG capsule Take by mouth.    . finasteride (PROSCAR) 5 MG tablet TK 1 T PO D    . Fluticasone-Umeclidin-Vilant (TRELEGY ELLIPTA) 100-62.5-25 MCG/INH AEPB Inhale into the lungs.    . magnesium oxide (MAG-OX) 400 MG tablet Take by mouth.    . montelukast (SINGULAIR) 10 MG tablet TK 1 T PO HS    . omeprazole (PRILOSEC) 40 MG capsule TK ONE C PO BID    . oxybutynin (DITROPAN) 5 MG tablet TK 1 T PO BID    . Potassium 99 MG TABS Take by mouth.    . STIOLTO RESPIMAT 2.5-2.5 MCG/ACT AERS INL 2 PFS PO QD    . tamsulosin (FLOMAX) 0.4 MG CAPS capsule TK 1 C PO D    . vitamin B-12 (CYANOCOBALAMIN) 1000 MCG tablet Take by mouth.     No current facility-administered medications on file prior to visit.    There are no Patient Instructions on file for this visit. No follow-ups on  file.   Georgiana Spinner, NP

## 2021-10-02 ENCOUNTER — Other Ambulatory Visit (INDEPENDENT_AMBULATORY_CARE_PROVIDER_SITE_OTHER): Payer: Medicare PPO

## 2021-10-02 ENCOUNTER — Ambulatory Visit (INDEPENDENT_AMBULATORY_CARE_PROVIDER_SITE_OTHER): Payer: Medicare PPO | Admitting: Vascular Surgery

## 2021-10-02 ENCOUNTER — Encounter (INDEPENDENT_AMBULATORY_CARE_PROVIDER_SITE_OTHER): Payer: Medicare PPO

## 2021-10-02 NOTE — Progress Notes (Unsigned)
MRN : [id number]  Brent Liu is a 86 y.o. (July 01, 1936) male who presents with chief complaint of check up.  History of Present Illness:  The patient presents to the office for initial evaluation and surveillance of an abdominal aortic aneurysm status post stent graft placement in 2015.  Aneurysm was fixed in [place] and he states he has not had any follow up since repair.   Patient denies abdominal pain or back pain, no other abdominal complaints. No groin related complaints. No symptoms consistent with distal embolization No changes in claudication distance.    There have been no interval changes in his overall healthcare since his last visit.    Patient denies amaurosis fugax or TIA symptoms. There is no history of claudication or rest pain symptoms of the lower extremities. The patient denies angina or shortness of breath.    Duplex ultrasound of the aorta shows an intact stent graft no endoleak sac is 3.2 cm   Previous carotid duplex shows <40% bilateral ICA stenosis  No outpatient medications have been marked as taking for the 10/02/21 encounter (Appointment) with Gilda Crease, Latina Craver, MD.    Past Medical History:  Diagnosis Date   AAA (abdominal aortic aneurysm) (HCC)    COPD (chronic obstructive pulmonary disease) (HCC)     Past Surgical History:  Procedure Laterality Date   ABDOMINAL AORTIC ANEURYSM REPAIR     APPENDECTOMY     REPLACEMENT TOTAL KNEE Bilateral    TONSILLECTOMY      Social History Social History   Tobacco Use   Smoking status: Former   Smokeless tobacco: Never  Substance Use Topics   Alcohol use: Never   Drug use: Never    Family History Family History  Problem Relation Age of Onset   Cancer Mother    Stroke Father    Heart attack Father     No Known Allergies   REVIEW OF SYSTEMS (Negative unless checked)  Constitutional: [] Weight loss  [] Fever  [] Chills Cardiac: [] Chest pain   [] Chest pressure   [] Palpitations   [] Shortness of  breath when laying flat   [] Shortness of breath with exertion. Vascular:  [] Pain in legs with walking   [] Pain in legs at rest  [] History of DVT   [] Phlebitis   [] Swelling in legs   [] Varicose veins   [] Non-healing ulcers Pulmonary:   [] Uses home oxygen   [] Productive cough   [] Hemoptysis   [] Wheeze  [] COPD   [] Asthma Neurologic:  [] Dizziness   [] Seizures   [] History of stroke   [] History of TIA  [] Aphasia   [] Vissual changes   [] Weakness or numbness in arm   [] Weakness or numbness in leg Musculoskeletal:   [] Joint swelling   [] Joint pain   [] Low back pain Hematologic:  [] Easy bruising  [] Easy bleeding   [] Hypercoagulable state   [] Anemic Gastrointestinal:  [] Diarrhea   [] Vomiting  [x] Gastroesophageal reflux/heartburn   [] Difficulty swallowing. Genitourinary:  [] Chronic kidney disease   [] Difficult urination  [] Frequent urination   [] Blood in urine Skin:  [] Rashes   [] Ulcers  Psychological:  [] History of anxiety   []  History of major depression.  Physical Examination  There were no vitals filed for this visit. There is no height or weight on file to calculate BMI. Gen: WD/WN, NAD Head: [place]/AT, No temporalis wasting.  Ear/Nose/Throat: Hearing grossly intact, nares w/o erythema or drainage Eyes: PER, EOMI, sclera nonicteric.  Neck: Supple, no masses.  No bruit or JVD.  Pulmonary:  Good air movement, no audible  wheezing, no use of accessory muscles.  Cardiac: RRR, normal S1, S2, no Murmurs. Vascular:    Vessel Right Left  Radial Palpable Palpable  Carotid Palpable Palpable  PT Palpable Palpable  DP Palpable Palpable  Gastrointestinal: soft, non-distended. No guarding/no peritoneal signs.  Musculoskeletal: M/S 5/5 throughout.  No visible deformity.  Neurologic: CN 2-12 intact. Pain and light touch intact in extremities.  Symmetrical.  Speech is fluent. Motor exam as listed above. Psychiatric: Judgment intact, Mood & affect appropriate for pt's clinical situation. Dermatologic: No rashes or  ulcers noted.  No changes consistent with cellulitis.   CBC No results found for: WBC, HGB, HCT, MCV, PLT  BMET    Component Value Date/Time   K 4.7 01/11/2013 1210   CrCl cannot be calculated (No successful lab value found.).  COAG No results found for: INR, PROTIME  Radiology No results found.   Assessment/Plan There are no diagnoses linked to this encounter.   Levora Dredge, MD  10/02/2021 9:26 AM

## 2022-06-15 ENCOUNTER — Encounter (INDEPENDENT_AMBULATORY_CARE_PROVIDER_SITE_OTHER): Payer: Self-pay
